# Patient Record
Sex: Male | Born: 1960 | Race: Black or African American | Hispanic: No | Marital: Single | Smoking: Never smoker
Health system: Southern US, Community
[De-identification: ages and names within clinical notes are randomized; demographics above are authoritative.]

## PROBLEM LIST (undated history)

## (undated) ENCOUNTER — Ambulatory Visit: Admission: EM | Source: Home / Self Care

## (undated) DIAGNOSIS — R569 Unspecified convulsions: Secondary | ICD-10-CM

## (undated) DIAGNOSIS — I10 Essential (primary) hypertension: Secondary | ICD-10-CM

## (undated) DIAGNOSIS — K2960 Other gastritis without bleeding: Secondary | ICD-10-CM

## (undated) HISTORY — PX: SHUNT EXTERNALIZATION: SHX341

---

## 2000-08-30 DIAGNOSIS — R569 Unspecified convulsions: Secondary | ICD-10-CM | POA: Insufficient documentation

## 2009-02-17 ENCOUNTER — Emergency Department: Payer: Self-pay | Admitting: Emergency Medicine

## 2009-11-08 DIAGNOSIS — I10 Essential (primary) hypertension: Secondary | ICD-10-CM | POA: Insufficient documentation

## 2011-11-29 ENCOUNTER — Emergency Department: Payer: Self-pay | Admitting: Unknown Physician Specialty

## 2012-01-09 ENCOUNTER — Emergency Department: Payer: Self-pay | Admitting: Emergency Medicine

## 2012-01-09 LAB — COMPREHENSIVE METABOLIC PANEL
Albumin: 4.3 g/dL (ref 3.4–5.0)
Alkaline Phosphatase: 76 U/L (ref 50–136)
Anion Gap: 8 (ref 7–16)
BUN: 13 mg/dL (ref 7–18)
Bilirubin,Total: 0.4 mg/dL (ref 0.2–1.0)
Calcium, Total: 9.5 mg/dL (ref 8.5–10.1)
Chloride: 98 mmol/L (ref 98–107)
Creatinine: 1.33 mg/dL — ABNORMAL HIGH (ref 0.60–1.30)
EGFR (Non-African Amer.): >60
Osmolality: 274 (ref 275–301)
Potassium: 3.8 mmol/L (ref 3.5–5.1)
SGPT (ALT): 25 U/L
Sodium: 137 mmol/L (ref 136–145)
Total Protein: 8.8 g/dL — ABNORMAL HIGH (ref 6.4–8.2)

## 2012-01-09 LAB — CBC
HGB: 16.2 g/dL (ref 13.0–18.0)
MCHC: 34.1 g/dL (ref 32.0–36.0)
RBC: 5.36 10*6/uL (ref 4.40–5.90)
WBC: 4.9 10*3/uL (ref 3.8–10.6)

## 2013-10-20 ENCOUNTER — Emergency Department: Payer: Self-pay | Admitting: Emergency Medicine

## 2013-10-20 LAB — URINALYSIS, COMPLETE
Bacteria: NONE SEEN
Bilirubin,UR: NEGATIVE
Glucose,UR: NEGATIVE mg/dL (ref 0–75)
Ketone: NEGATIVE
LEUKOCYTE ESTERASE: NEGATIVE
NITRITE: NEGATIVE
Ph: 5 (ref 4.5–8.0)
Protein: NEGATIVE
RBC,UR: 1 /HPF (ref 0–5)
Specific Gravity: 1.014 (ref 1.003–1.030)
Squamous Epithelial: <1
WBC UR: 1 /HPF (ref 0–5)

## 2013-10-20 LAB — BASIC METABOLIC PANEL
Anion Gap: 6 — ABNORMAL LOW (ref 7–16)
BUN: 14 mg/dL (ref 7–18)
CHLORIDE: 98 mmol/L (ref 98–107)
CREATININE: 1.43 mg/dL — AB (ref 0.60–1.30)
Calcium, Total: 9 mg/dL (ref 8.5–10.1)
Co2: 31 mmol/L (ref 21–32)
EGFR (African American): >60
GFR CALC NON AF AMER: 56 — AB
GLUCOSE: 137 mg/dL — AB (ref 65–99)
Osmolality: 273 (ref 275–301)
POTASSIUM: 3.2 mmol/L — AB (ref 3.5–5.1)
SODIUM: 135 mmol/L — AB (ref 136–145)

## 2013-10-20 LAB — HEPATIC FUNCTION PANEL A (ARMC)
Albumin: 4.2 g/dL (ref 3.4–5.0)
Alkaline Phosphatase: 50 U/L
Bilirubin, Direct: 0.1 mg/dL (ref 0.00–0.20)
Bilirubin,Total: 0.5 mg/dL (ref 0.2–1.0)
SGOT(AST): 22 U/L (ref 15–37)
SGPT (ALT): 21 U/L (ref 12–78)
TOTAL PROTEIN: 8.1 g/dL (ref 6.4–8.2)

## 2013-10-20 LAB — TROPONIN I
Troponin-I: <0.02 ng/mL
Troponin-I: <0.02 ng/mL

## 2013-10-20 LAB — CBC WITH DIFFERENTIAL/PLATELET
Basophil #: 0 10*3/uL (ref 0.0–0.1)
Basophil %: 0.4 %
Eosinophil #: 0.1 10*3/uL (ref 0.0–0.7)
Eosinophil %: 1.6 %
HCT: 42.7 % (ref 40.0–52.0)
HGB: 14.1 g/dL (ref 13.0–18.0)
Lymphocyte #: 0.3 10*3/uL — ABNORMAL LOW (ref 1.0–3.6)
Lymphocyte %: 4.9 %
MCH: 29.8 pg (ref 26.0–34.0)
MCHC: 33.1 g/dL (ref 32.0–36.0)
MCV: 90 fL (ref 80–100)
MONOS PCT: 7.1 %
Monocyte #: 0.5 x10 3/mm (ref 0.2–1.0)
NEUTROS ABS: 6 10*3/uL (ref 1.4–6.5)
NEUTROS PCT: 86 %
PLATELETS: 170 10*3/uL (ref 150–440)
RBC: 4.74 10*6/uL (ref 4.40–5.90)
RDW: 13.8 % (ref 11.5–14.5)
WBC: 7 10*3/uL (ref 3.8–10.6)

## 2013-10-20 LAB — TSH: THYROID STIMULATING HORM: 0.623 u[IU]/mL

## 2014-11-01 IMAGING — CR DG CHEST 2V
1 series · 2 of 2 positions shown · non-contrast
Comparison: 01/09/2012

CLINICAL DATA: Cough and nausea for several days

EXAM:
CHEST  2 VIEW

[Series 1: w chest pa · 0.14mm/px · 2 of 2 slices shown]
[im 1/2]
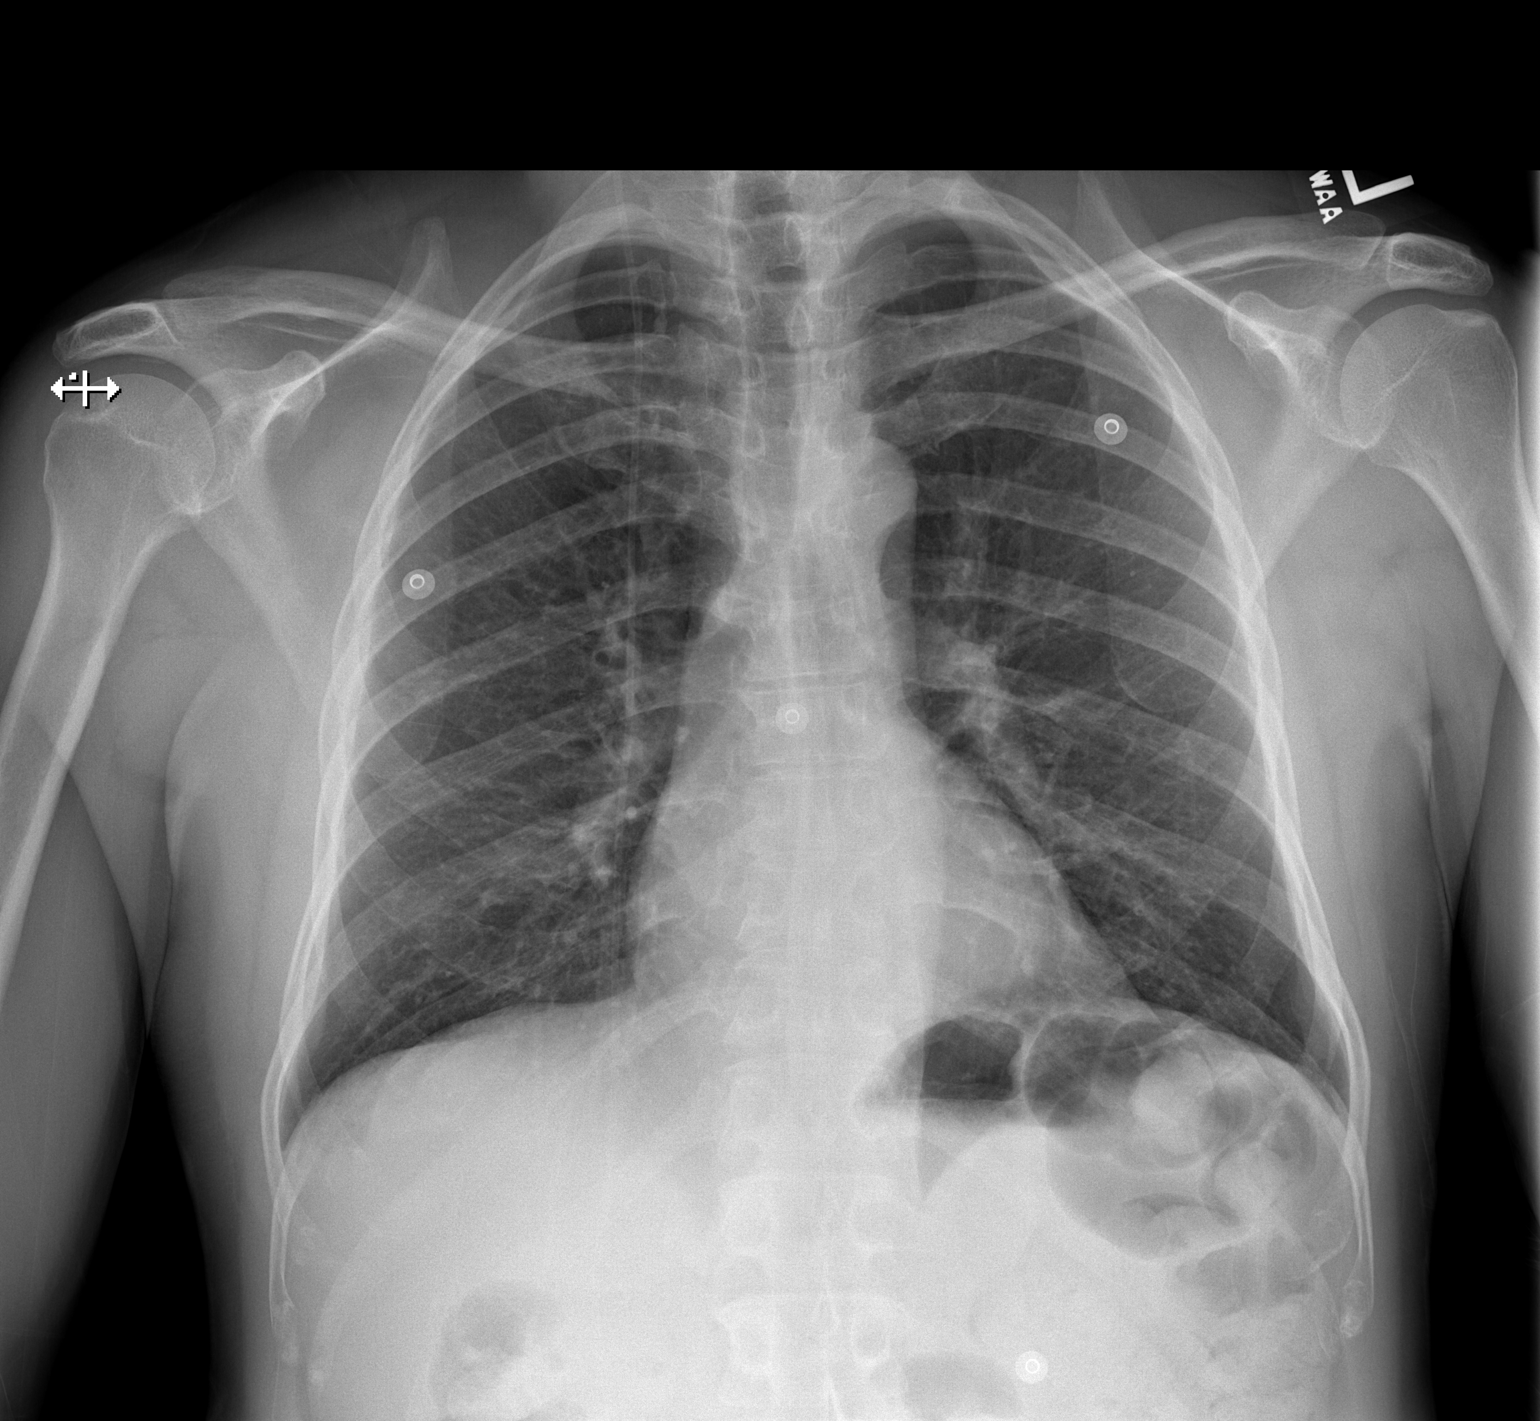
[im 2/2]
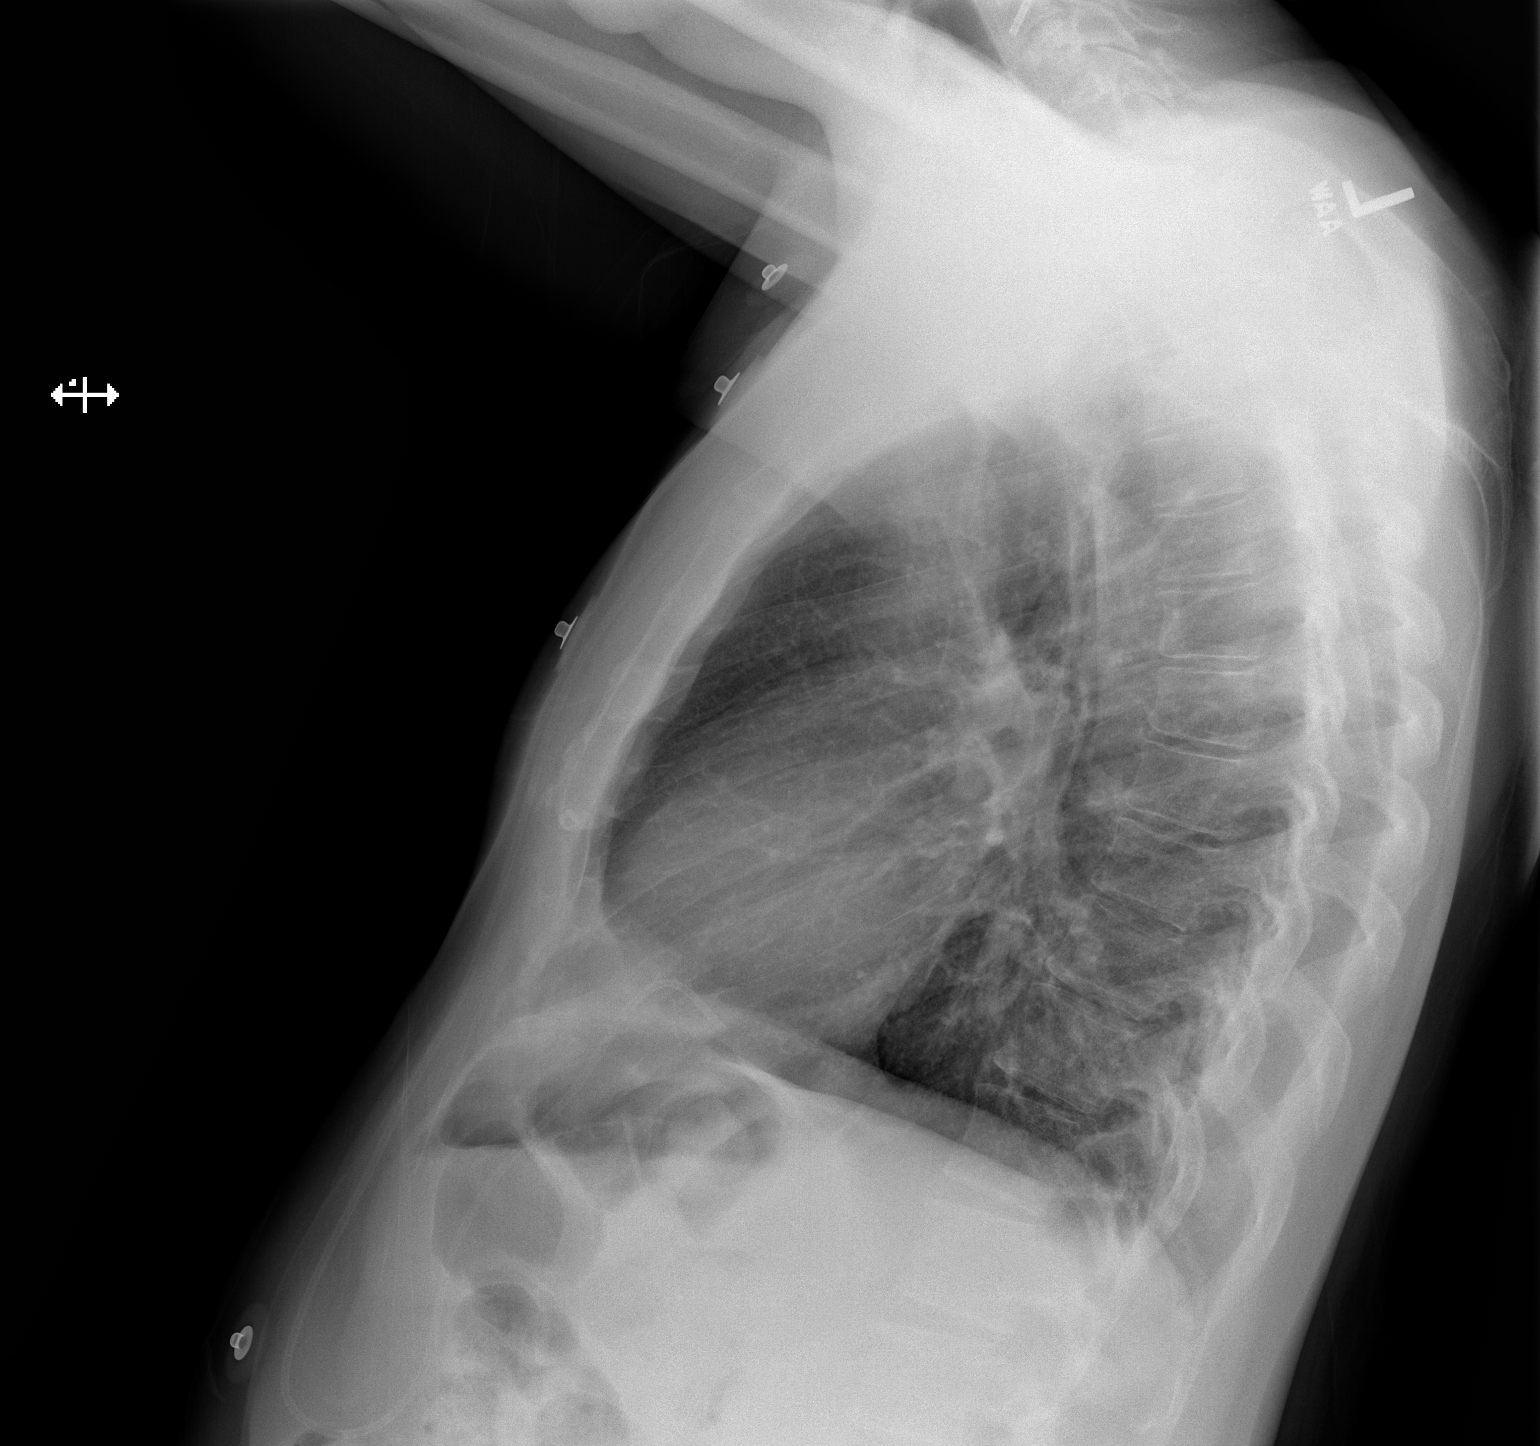

[2 of 2 positions shown; findings below may reference images not displayed]

FINDINGS: There is a right-sided ventriculoperitoneal shunt catheter. The
heart size and mediastinal contours are within normal limits. Both
lungs are clear. The visualized skeletal structures are
unremarkable.
IMPRESSION: No active cardiopulmonary disease.

## 2016-10-03 ENCOUNTER — Ambulatory Visit: Payer: Self-pay | Admitting: Pharmacy Technician

## 2016-10-03 ENCOUNTER — Encounter (INDEPENDENT_AMBULATORY_CARE_PROVIDER_SITE_OTHER): Payer: Self-pay

## 2016-10-03 DIAGNOSIS — Z79899 Other long term (current) drug therapy: Secondary | ICD-10-CM

## 2016-10-03 NOTE — Progress Notes (Signed)
Completed [facility] application and contract.  Patient agreed to all terms of the [facility] contract.  Patient to provide notarized letter of support and utility bill.  Provided patient with Community education officercommunity resource material based on his particular needs.    Sherilyn DacostaBetty J. Aneesa Romey Care Manager [facility]

## 2016-11-07 ENCOUNTER — Telehealth: Payer: Self-pay | Admitting: Pharmacy Technician

## 2016-11-07 NOTE — Telephone Encounter (Signed)
Patient provided poi.  Approved for medication assistance through 2018, as long as eligibility criteria continues to be met.  [facility]

## 2017-10-17 ENCOUNTER — Telehealth: Payer: Self-pay | Admitting: Pharmacy Technician

## 2017-10-17 NOTE — Telephone Encounter (Signed)
Patient failed to provide 2019 poi.  No additional medication assistance will be provided by [facility] without the required proof of income documentation.  Patient notified by letter.  Jadiel Schmieder J. Alizee Maple Care Manager [facility] 

## 2017-11-12 ENCOUNTER — Telehealth: Payer: Self-pay | Admitting: Pharmacy Technician

## 2017-11-12 NOTE — Telephone Encounter (Signed)
Received updated proof of income.  Patient eligible to receive medication assistance at [facility] through 2019, as long as eligibility requirements continue to be met.  [facility]

## 2017-12-19 ENCOUNTER — Encounter: Payer: Self-pay | Admitting: Pharmacist

## 2018-02-17 ENCOUNTER — Encounter: Payer: Self-pay | Admitting: Pharmacist

## 2018-12-10 ENCOUNTER — Ambulatory Visit
Admission: EM | Admit: 2018-12-10 | Discharge: 2018-12-10 | Disposition: A | Discharge status: Home / Self Care | Payer: Medicare Other | Attending: Family Medicine | Admitting: Family Medicine

## 2018-12-10 ENCOUNTER — Other Ambulatory Visit: Payer: Self-pay

## 2018-12-10 ENCOUNTER — Encounter: Payer: Self-pay | Admitting: Emergency Medicine

## 2018-12-10 DIAGNOSIS — R109 Unspecified abdominal pain: Secondary | ICD-10-CM | POA: Insufficient documentation

## 2018-12-10 DIAGNOSIS — R110 Nausea: Secondary | ICD-10-CM | POA: Diagnosis not present

## 2018-12-10 HISTORY — DX: Other gastritis without bleeding: K29.60

## 2018-12-10 HISTORY — DX: Unspecified convulsions: R56.9

## 2018-12-10 HISTORY — DX: Essential (primary) hypertension: I10

## 2018-12-10 LAB — URINALYSIS, COMPLETE (UACMP) WITH MICROSCOPIC
Bacteria, UA: NONE SEEN
Bilirubin Urine: NEGATIVE
Glucose, UA: NEGATIVE mg/dL
Hgb urine dipstick: NEGATIVE
Ketones, ur: NEGATIVE mg/dL
Leukocytes,Ua: NEGATIVE
Nitrite: NEGATIVE
Protein, ur: NEGATIVE mg/dL
Specific Gravity, Urine: 1.02 (ref 1.005–1.030)
Squamous Epithelial / HPF: NONE SEEN (ref 0–5)
pH: 7.5 (ref 5.0–8.0)

## 2018-12-10 MED ORDER — TAMSULOSIN HCL 0.4 MG PO CAPS: 0.4000 mg | ORAL_CAPSULE | Freq: Every day | ORAL | 0 refills | Status: DC

## 2018-12-10 MED ORDER — TRAMADOL HCL 50 MG PO TABS: 50.0000 mg | ORAL_TABLET | Freq: Three times a day (TID) | ORAL | 0 refills | Status: DC | PRN

## 2018-12-10 NOTE — ED Provider Notes (Signed)
[facility]    CSN: [id number] Arrival date & time: 12/10/18  1517  History   Chief Complaint Chief Complaint  Patient presents with  . Abdominal Pain   HPI  58 year old male presents with right flank pain.  Patient reports he developed right flank pain 3 to 4 weeks ago.  Has continued to persist.  Patient states that it is worse with certain movements.  Had severe pain yesterday.  Was given a "pain pill" from his mother with some improvement.  He reports associated nausea.  No abdominal pain.  No urinary symptoms.  Pain is currently mild to moderate in severity.  No known inciting factor.  No other associated symptoms. No other complaints.  History reviewed as below. PMH: Hypertension    Tinnitus    Depression    Seizures (CMS-HCC)    Hydrocephalus (CMS-HCC)    Hearing loss in right ear     Surgical Hx: VENTRICULOPERITONEAL SHUNT   58 years of age     Home Medications    Prior to Admission medications   Medication Sig Start Date End Date Taking? Authorizing Provider  atorvastatin (LIPITOR) 40 MG tablet  12/02/18  Yes [provider]  hydrochlorothiazide (HYDRODIURIL) 25 MG tablet TAKE ONE TABLET BY MOUTH ONCE DAILY 01/04/13  Yes [provider]  lamoTRIgine (LAMICTAL) 100 MG tablet  12/02/18  Yes [provider]  lisinopril (ZESTRIL) 10 MG tablet Take by mouth.   Yes [provider]  losartan (COZAAR) 50 MG tablet Take by mouth.   Yes [provider]  tamsulosin (FLOMAX) 0.4 MG CAPS capsule Take 1 capsule (0.4 mg total) by mouth daily. 12/10/18   Tommie Samsook, Courtland Coppa G, DO  traMADol (ULTRAM) 50 MG tablet Take 1 tablet (50 mg total) by mouth every 8 (eight) hours as needed for moderate pain or severe pain. 12/10/18   Tommie Samsook, Adlee Paar G, DO   Social History Social History   Tobacco Use  . Smoking status: Never Smoker  . Smokeless tobacco: Never Used  Substance Use Topics  . Alcohol use: Never    Frequency: Never  .  Drug use: Never    Allergies   Patient has no known allergies.   Review of Systems Review of Systems  Constitutional: Negative.   Gastrointestinal: Positive for nausea.  Genitourinary: Positive for flank pain.   Physical Exam Triage Vital Signs ED Triage Vitals  Enc Vitals Group     BP 12/10/18 1530 133/86     Pulse Rate 12/10/18 1530 83     Resp 12/10/18 1530 18     Temp 12/10/18 1530 98.4 F (36.9 C)     Temp Source 12/10/18 1530 Oral     SpO2 12/10/18 1530 98 %     Weight 12/10/18 1531 190 lb (86.2 kg)     Height 12/10/18 1531 5\' 2"  (1.575 m)     Head Circumference --      Peak Flow --      Pain Score 12/10/18 1531 5     Pain Loc --      Pain Edu? --      Excl. in GC? --    Updated Vital Signs BP 133/86 (BP Location: Right Arm)   Pulse 83   Temp 98.4 F (36.9 C) (Oral)   Resp 18   Ht 5\' 2"  (1.575 m)   Wt 86.2 kg   SpO2 98%   BMI 34.75 kg/m   Visual Acuity Right Eye Distance:   Left Eye Distance:  Bilateral Distance:    Right Eye Near:   Left Eye Near:    Bilateral Near:     Physical Exam Vitals signs and nursing note reviewed.  Constitutional:      General: He is not in acute distress.    Appearance: Normal appearance. He is obese.  HENT:     Head: Normocephalic and atraumatic.  Eyes:     General:        Right eye: No discharge.        Left eye: No discharge.     Conjunctiva/sclera: Conjunctivae normal.  Cardiovascular:     Rate and Rhythm: Normal rate and regular rhythm.  Pulmonary:     Effort: Pulmonary effort is normal.     Breath sounds: Normal breath sounds.  Abdominal:     General: There is no distension.     Palpations: Abdomen is soft.     Comments: No CVA tenderness.   Neurological:     Mental Status: He is alert.  Psychiatric:        Behavior: Behavior normal.     Comments: Flat affect.    UC Treatments / Results  Labs (all labs ordered are listed, but only abnormal results are displayed) Labs Reviewed  URINALYSIS,  COMPLETE (UACMP) WITH MICROSCOPIC    EKG None  Radiology No results found.  Procedures Procedures (including critical care time)  Medications Ordered in UC Medications - No data to display  Initial Impression / Assessment and Plan / UC Course  I have reviewed the triage vital signs and the nursing notes.  Pertinent labs & imaging results that were available during my care of the patient were reviewed by me and considered in my medical decision making (see chart for details).    58 year old male presents with flank pain.  Musculoskeletal versus secondary to nephrolithiasis.  I favor musculoskeletal etiology.  Urinalysis without hematuria.  Tramadol as needed.  Placing on Flomax as patient has history of BPH and may help if he has an underlying stone.  Final Clinical Impressions(s) / UC Diagnoses   Final diagnoses:  Flank pain     Discharge Instructions     Medication as directed.   Be sure to drink plenty of fluids.  Follow up with [facility] if this persists.  Take care  Dr. Adriana Simas     ED Prescriptions    Medication Sig Dispense Auth. Provider   tamsulosin (FLOMAX) 0.4 MG CAPS capsule Take 1 capsule (0.4 mg total) by mouth daily. 14 capsule Aurther Harlin G, DO   traMADol (ULTRAM) 50 MG tablet Take 1 tablet (50 mg total) by mouth every 8 (eight) hours as needed for moderate pain or severe pain. 15 tablet Tommie Sams, DO     Controlled Substance Prescriptions [place] Controlled Substance Registry consulted? Not Applicable   Tommie Sams, DO 12/10/18 1655

## 2018-12-10 NOTE — ED Triage Notes (Signed)
Patient c/o right side pain that started 3-4 weeks ago. He stated he woke up one morning and just had the pain and it hasn't gone away. Patient states he has had nausea but no other symptoms.

## 2018-12-10 NOTE — Discharge Instructions (Signed)
Medication as directed.   Be sure to drink plenty of fluids.  Follow up with [facility] if this persists.  Take care  Dr. Adriana Simas

## 2019-02-02 ENCOUNTER — Telehealth: Payer: Self-pay | Admitting: Pharmacy Technician

## 2019-02-02 NOTE — Telephone Encounter (Signed)
Patient has Medicare and a Part D plan. [facility] is unable to provide services until patient is in the gap. He can then apply by providing appropriate documentation and financial information. Patient notified by letter.  Velda Shell CPhT/Certification Specialist [facility]

## 2019-02-19 ENCOUNTER — Encounter: Payer: Self-pay | Admitting: Emergency Medicine

## 2019-02-19 ENCOUNTER — Other Ambulatory Visit: Payer: Self-pay

## 2019-02-19 ENCOUNTER — Ambulatory Visit
Admission: EM | Admit: 2019-02-19 | Discharge: 2019-02-19 | Disposition: A | Discharge status: Home / Self Care | Payer: Medicare Other | Attending: Family Medicine | Admitting: Family Medicine

## 2019-02-19 DIAGNOSIS — R05 Cough: Secondary | ICD-10-CM | POA: Diagnosis not present

## 2019-02-19 DIAGNOSIS — R5383 Other fatigue: Secondary | ICD-10-CM | POA: Diagnosis not present

## 2019-02-19 DIAGNOSIS — Z79899 Other long term (current) drug therapy: Secondary | ICD-10-CM | POA: Diagnosis not present

## 2019-02-19 DIAGNOSIS — R6889 Other general symptoms and signs: Secondary | ICD-10-CM | POA: Diagnosis not present

## 2019-02-19 DIAGNOSIS — G919 Hydrocephalus, unspecified: Secondary | ICD-10-CM | POA: Insufficient documentation

## 2019-02-19 DIAGNOSIS — J029 Acute pharyngitis, unspecified: Secondary | ICD-10-CM | POA: Insufficient documentation

## 2019-02-19 DIAGNOSIS — R0981 Nasal congestion: Secondary | ICD-10-CM | POA: Diagnosis not present

## 2019-02-19 DIAGNOSIS — I10 Essential (primary) hypertension: Secondary | ICD-10-CM | POA: Insufficient documentation

## 2019-02-19 DIAGNOSIS — Z20828 Contact with and (suspected) exposure to other viral communicable diseases: Secondary | ICD-10-CM | POA: Insufficient documentation

## 2019-02-19 DIAGNOSIS — Z20822 Contact with and (suspected) exposure to covid-19: Secondary | ICD-10-CM

## 2019-02-19 LAB — RAPID STREP SCREEN (MED CTR MEBANE ONLY): Streptococcus, Group A Screen (Direct): NEGATIVE

## 2019-02-19 MED ORDER — IPRATROPIUM BROMIDE 0.06 % NA SOLN: 2.0000 | Freq: Four times a day (QID) | NASAL | 0 refills | Status: AC | PRN

## 2019-02-19 MED ORDER — BENZONATATE 200 MG PO CAPS: 200.0000 mg | ORAL_CAPSULE | Freq: Three times a day (TID) | ORAL | 0 refills | Status: DC | PRN

## 2019-02-19 MED ORDER — IPRATROPIUM BROMIDE 0.06 % NA SOLN: 2.0000 | Freq: Four times a day (QID) | NASAL | 0 refills | Status: DC | PRN

## 2019-02-19 NOTE — ED Triage Notes (Signed)
Patient c/o nasal congestion, cough, sore throat that started 1 week ago. Has not checked his temperature.

## 2019-02-19 NOTE — Discharge Instructions (Signed)
Rest.  Fluids.  Medication as directed.   We will call with the results.  Take care  Dr. Lacinda Axon  Dr. Lacinda Axon

## 2019-02-19 NOTE — ED Provider Notes (Signed)
[facility]    CSN: [id number] Arrival date & time: 02/19/19  4166  History   Chief Complaint Chief Complaint  Patient presents with  . Cough  . Sore Throat  . Nasal Congestion   HPI  58 year old male presents with respiratory symptoms.  Patient reports that his symptoms started last week.  He reports sore throat, runny nose, cough.  Reports fatigue as well.  Cough is productive. No documented fever.  No reported sick contacts.  No medications or interventions tried.  No known exacerbating or relieving factors.  He is sore throat is 5/10 in severity.  No other reported symptoms.  No other complaints.  PMH, Surgical Hx, Family Hx, Social History reviewed and updated as below.  PMH: Hypertension    Tinnitus    Depression    Seizures (CMS-HCC)    Hydrocephalus (CMS-HCC)    Hearing loss in right ear      Surgical Hx: VENTRICULOPERITONEAL SHUNT   58 years of age     Home Medications    Prior to Admission medications   Medication Sig Start Date End Date Taking? Authorizing Provider  atorvastatin (LIPITOR) 40 MG tablet  12/02/18  Yes [provider]  hydrochlorothiazide (HYDRODIURIL) 25 MG tablet TAKE ONE TABLET BY MOUTH ONCE DAILY 01/04/13  Yes [provider]  lamoTRIgine (LAMICTAL) 100 MG tablet  12/02/18  Yes [provider]  lisinopril (ZESTRIL) 10 MG tablet Take by mouth.   Yes [provider]  losartan (COZAAR) 50 MG tablet Take by mouth.   Yes [provider]  tamsulosin (FLOMAX) 0.4 MG CAPS capsule Take 1 capsule (0.4 mg total) by mouth daily. 12/10/18  Yes Zariana Strub G, DO  traMADol (ULTRAM) 50 MG tablet Take 1 tablet (50 mg total) by mouth every 8 (eight) hours as needed for moderate pain or severe pain. 12/10/18  Yes Devaughn Savant G, DO  benzonatate (TESSALON) 200 MG capsule Take 1 capsule (200 mg total) by mouth 3 (three) times daily as needed for cough. 02/19/19   Thersa Salt G, DO  ipratropium  (ATROVENT) 0.06 % nasal spray Place 2 sprays into both nostrils 4 (four) times daily as needed for rhinitis. 02/19/19   Coral Spikes, DO   Family Hx: Hypertension Brother    Kidney disease Mother    GU problems Neg Hx    Prostate cancer Neg Hx      Social History Social History   Tobacco Use  . Smoking status: Never Smoker  . Smokeless tobacco: Never Used  Substance Use Topics  . Alcohol use: Never    Frequency: Never  . Drug use: Never     Allergies   Patient has no known allergies.   Review of Systems Review of Systems  Constitutional: Positive for fatigue. Negative for fever.  HENT: Positive for rhinorrhea and sore throat.   Respiratory: Positive for cough.    Physical Exam Triage Vital Signs ED Triage Vitals  Enc Vitals Group     BP 02/19/19 0841 (!) 144/77     Pulse Rate 02/19/19 0841 95     Resp 02/19/19 0841 18     Temp 02/19/19 0841 99.5 F (37.5 C)     Temp Source 02/19/19 0841 Oral     SpO2 02/19/19 0841 96 %     Weight --      Height 02/19/19 0844 5\' 1"  (1.549 m)     Head Circumference --      Peak Flow --  Pain Score 02/19/19 0843 5     Pain Loc --      Pain Edu? --      Excl. in GC? --    Updated Vital Signs BP (!) 144/77 (BP Location: Right Arm)   Pulse 95   Temp 99.5 F (37.5 C) (Oral)   Resp 18   Ht 5\' 1"  (1.549 m)   SpO2 96%   BMI 35.90 kg/m   Visual Acuity Right Eye Distance:   Left Eye Distance:   Bilateral Distance:    Right Eye Near:   Left Eye Near:    Bilateral Near:     Physical Exam Vitals signs and nursing note reviewed.  Constitutional:      General: He is not in acute distress.    Appearance: Normal appearance.  HENT:     Head: Normocephalic and atraumatic.     Right Ear: Tympanic membrane normal.     Left Ear: Tympanic membrane normal.     Mouth/Throat:     Pharynx: Posterior oropharyngeal erythema present. No oropharyngeal exudate.  Eyes:     General:        Right eye: No discharge.         Left eye: No discharge.     Conjunctiva/sclera: Conjunctivae normal.  Cardiovascular:     Rate and Rhythm: Normal rate and regular rhythm.  Pulmonary:     Effort: Pulmonary effort is normal.     Breath sounds: Normal breath sounds. No wheezing, rhonchi or rales.  Neurological:     Mental Status: He is alert.  Psychiatric:        Mood and Affect: Mood normal.        Behavior: Behavior normal.    UC Treatments / Results  Labs (all labs ordered are listed, but only abnormal results are displayed) Labs Reviewed  RAPID STREP SCREEN ([facility] ONLY)  NOVEL CORONAVIRUS, NAA (HOSPITAL ORDER, SEND-OUT TO REF LAB)  CULTURE, GROUP A STREP [facility])    EKG   Radiology No results found.  Procedures Procedures (including critical care time)  Medications Ordered in UC Medications - No data to display  Initial Impression / Assessment and Plan / UC Course  I have reviewed the triage vital signs and the nursing notes.  Pertinent labs & imaging results that were available during my care of the patient were reviewed by me and considered in my medical decision making (see chart for details).    58 year old male presents with possible COVID-19.  Strep negative.  Awaiting COVID testing results.  Treating symptomatically with Tessalon Perles and Atrovent nasal spray.  Final Clinical Impressions(s) / UC Diagnoses   Final diagnoses:  Suspected Covid-19 Virus Infection     Discharge Instructions     Rest.  Fluids.  Medication as directed.   We will call with the results.  Take care  Dr. Adriana Simasook  Dr. Adriana Simasook   ED Prescriptions    Medication Sig Dispense Auth. Provider   benzonatate (TESSALON) 200 MG capsule Take 1 capsule (200 mg total) by mouth 3 (three) times daily as needed for cough. 20 capsule Cherene Dobbins G, DO   ipratropium (ATROVENT) 0.06 % nasal spray Place 2 sprays into both nostrils 4 (four) times daily as needed for rhinitis. 15 mL Tommie Samsook, Younes Degeorge G, DO     Controlled  Substance Prescriptions [place] Controlled Substance Registry consulted? Not Applicable   Tommie SamsCook, Brittinie Wherley G, [place]DO 02/19/19 [id number]

## 2019-02-20 LAB — NOVEL CORONAVIRUS, NAA (HOSP ORDER, SEND-OUT TO REF LAB; TAT 18-24 HRS): SARS-CoV-2, NAA: NOT DETECTED

## 2019-02-21 LAB — CULTURE, GROUP A STREP (THRC)

## 2020-07-28 ENCOUNTER — Encounter: Payer: Self-pay | Admitting: Family Medicine

## 2020-08-24 ENCOUNTER — Other Ambulatory Visit: Payer: Self-pay

## 2020-08-24 ENCOUNTER — Encounter: Payer: Self-pay | Admitting: Gastroenterology

## 2020-08-24 ENCOUNTER — Ambulatory Visit: Payer: Medicare HMO | Admitting: Gastroenterology

## 2020-08-24 VITALS — BP 134/79 | HR 88 | Ht 62.0 in | Wt 158.6 lb

## 2020-08-24 DIAGNOSIS — Z1211 Encounter for screening for malignant neoplasm of colon: Secondary | ICD-10-CM | POA: Diagnosis not present

## 2020-08-24 DIAGNOSIS — K219 Gastro-esophageal reflux disease without esophagitis: Secondary | ICD-10-CM | POA: Diagnosis not present

## 2020-08-24 MED ORDER — NA SULFATE-K SULFATE-MG SULF 17.5-3.13-1.6 GM/177ML PO SOLN: 1.0000 | Freq: Once | ORAL | 0 refills | Status: AC

## 2020-08-24 NOTE — Progress Notes (Signed)
Wyline Mood MD, MRCP(U.K) [address]  [address]  [place], [place] [address]  Main: [phone]  Fax: [phone]   Gastroenterology Consultation  Referring Provider:     Preston Fleeting* Primary Care Physician:  Patient, No Pcp Per Primary Gastroenterologist:  Dr. Wyline Mood  Reason for Consultation:     GERD and screening colonoscopy        HPI:   Gregory Rose is a 60 y.o. y/o male referred for GERD and screening colonoscopy.   Reflux:  Onset : few years < 10  Symptoms: Heartburn Recent weight gain: None Medications: Taking omeprazole 20 mg twice daily but after meals Narcotics or anticholinergics use : None PPI /H2 blockers or Antacid  use and timing : As above Dinner time : Sometimes misses dinner Prior EGD: None   Last colonoscopy : None Rectal bleeding : None Change in bowel habits : None Weight loss : None Family history of colon cancer or polyps : Unclear      Past Medical History:  Diagnosis Date  . Hypertension   . Reflux gastritis   . Seizures (HCC)     No past surgical history on file.  Prior to Admission medications   Medication Sig Start Date End Date Taking? Authorizing Provider  atorvastatin (LIPITOR) 40 MG tablet  12/02/18   [provider]  benzonatate (TESSALON) 200 MG capsule Take 1 capsule (200 mg total) by mouth 3 (three) times daily as needed for cough. 02/19/19   Tommie Sams, DO  hydrochlorothiazide (HYDRODIURIL) 25 MG tablet TAKE ONE TABLET BY MOUTH ONCE DAILY 01/04/13   [provider]  ipratropium (ATROVENT) 0.06 % nasal spray Place 2 sprays into both nostrils 4 (four) times daily as needed for rhinitis. 02/19/19   Tommie Sams, DO  lamoTRIgine (LAMICTAL) 100 MG tablet  12/02/18   [provider]  lisinopril (ZESTRIL) 10 MG tablet Take by mouth.    [provider]  losartan (COZAAR) 50 MG tablet Take by mouth.    [provider]  tamsulosin (FLOMAX) 0.4 MG CAPS capsule  Take 1 capsule (0.4 mg total) by mouth daily. 12/10/18   Tommie Sams, DO  traMADol (ULTRAM) 50 MG tablet Take 1 tablet (50 mg total) by mouth every 8 (eight) hours as needed for moderate pain or severe pain. 12/10/18   Tommie Sams, DO    No family history on file.   Social History   Tobacco Use  . Smoking status: Never Smoker  . Smokeless tobacco: Never Used  Substance Use Topics  . Alcohol use: Never  . Drug use: Never    Allergies as of 08/24/2020  . (No Known Allergies)    Review of Systems:    All systems reviewed and negative except where noted in HPI.   Physical Exam:  There were no vitals taken for this visit. No LMP for male patient. Psych:  Alert and cooperative. Normal mood and affect. General:   Alert,  Well-developed, well-nourished, pleasant and cooperative in NAD Head:  Normocephalic and atraumatic. Lungs:  Respirations even and unlabored.  Clear throughout to auscultation.   No wheezes, crackles, or rhonchi. No acute distress. Heart:  Regular rate and rhythm; no murmurs, clicks, rubs, or gallops. Abdomen:  Normal bowel sounds.  No bruits.  Soft, non-tender and non-distended without  Neurologic:  Alert and oriented x3;  grossly normal neurologically. Psych:  Alert and cooperative. Normal mood and affect.  Imaging Studies: No results found.  Assessment  and Plan:   Gregory Rose is a 60 y.o. y/o male has been referred for GERD and colon cancer screening  Plan #1.  Colonoscopy #2.  GERD patient information provided and discussed about lifestyle changes.  Picture of a wedge pillow provided 3.  Suggested to take his omeprazole 20 mg twice daily on an empty stomach first thing in the morning and at nighttime before his dinner   I have discussed alternative options, risks & benefits,  which include, but are not limited to, bleeding, infection, perforation,respiratory complication & drug reaction.  The patient agrees with this plan & written consent will be  obtained.     Follow up in 4 months  Dr Wyline Mood MD,MRCP(U.K)

## 2020-08-24 NOTE — Patient Instructions (Signed)
Gastroesophageal Reflux Disease, Adult  Gastroesophageal reflux (GER) happens when acid from the stomach flows up into the tube that connects the mouth and the stomach (esophagus). Normally, food travels down the esophagus and stays in the stomach to be digested. With GER, food and stomach acid sometimes move back up into the esophagus. You may have a disease called gastroesophageal reflux disease (GERD) if the reflux:  Happens often.  Causes frequent or very bad symptoms.  Causes problems such as damage to the esophagus. When this happens, the esophagus becomes sore and swollen. Over time, GERD can make small holes (ulcers) in the lining of the esophagus. What are the causes? This condition is caused by a problem with the muscle between the esophagus and the stomach. When this muscle is weak or not normal, it does not close properly to keep food and acid from coming back up from the stomach. The muscle can be weak because of:  Tobacco use.  Pregnancy.  Having a certain type of hernia (hiatal hernia).  Alcohol use.  Certain foods and drinks, such as coffee, chocolate, onions, and peppermint. What increases the risk?  Being overweight.  Having a disease that affects your connective tissue.  Taking NSAIDs, such a ibuprofen. What are the signs or symptoms?  Heartburn.  Difficult or painful swallowing.  The feeling of having a lump in the throat.  A bitter taste in the mouth.  Bad breath.  Having a lot of saliva.  Having an upset or bloated stomach.  Burping.  Chest pain. Different conditions can cause chest pain. Make sure you see your doctor if you have chest pain.  Shortness of breath or wheezing.  A long-term cough or a cough at night.  Wearing away of the surface of teeth (tooth enamel).  Weight loss. How is this treated?  Making changes to your diet.  Taking medicine.  Having surgery. Treatment will depend on how bad your symptoms are. Follow these  instructions at home: Eating and drinking  Follow a diet as told by your doctor. You may need to avoid foods and drinks such as: ? Coffee and tea, with or without caffeine. ? Drinks that contain alcohol. ? Energy drinks and sports drinks. ? Bubbly (carbonated) drinks or sodas. ? Chocolate and cocoa. ? Peppermint and mint flavorings. ? Garlic and onions. ? Horseradish. ? Spicy and acidic foods. These include peppers, chili powder, curry powder, vinegar, hot sauces, and BBQ sauce. ? Citrus fruit juices and citrus fruits, such as oranges, lemons, and limes. ? Tomato-based foods. These include red sauce, chili, salsa, and pizza with red sauce. ? Fried and fatty foods. These include donuts, french fries, potato chips, and high-fat dressings. ? High-fat meats. These include hot dogs, rib eye steak, sausage, ham, and bacon. ? High-fat dairy items, such as whole milk, butter, and cream cheese.  Eat small meals often. Avoid eating large meals.  Avoid drinking large amounts of liquid with your meals.  Avoid eating meals during the 2-3 hours before bedtime.  Avoid lying down right after you eat.  Do not exercise right after you eat.   Lifestyle  Do not smoke or use any products that contain nicotine or tobacco. If you need help quitting, ask your doctor.  Try to lower your stress. If you need help doing this, ask your doctor.  If you are overweight, lose an amount of weight that is healthy for you. Ask your doctor about a safe weight loss goal.   General instructions    Pay attention to any changes in your symptoms.  Take over-the-counter and prescription medicines only as told by your doctor.  Do not take aspirin, ibuprofen, or other NSAIDs unless your doctor says it is okay.  Wear loose clothes. Do not wear anything tight around your waist.  Raise (elevate) the head of your bed about 6 inches (15 cm). You may need to use a wedge to do this.  Avoid bending over if this makes your  symptoms worse.  Keep all follow-up visits. Contact a doctor if:  You have new symptoms.  You lose weight and you do not know why.  You have trouble swallowing or it hurts to swallow.  You have wheezing or a cough that keeps happening.  You have a hoarse voice.  Your symptoms do not get better with treatment. Get help right away if:  You have sudden pain in your arms, neck, jaw, teeth, or back.  You suddenly feel sweaty, dizzy, or light-headed.  You have chest pain or shortness of breath.  You vomit and the vomit is green, yellow, or black, or it looks like blood or coffee grounds.  You faint.  Your poop (stool) is red, bloody, or black.  You cannot swallow, drink, or eat. These symptoms may represent a serious problem that is an emergency. Do not wait to see if the symptoms will go away. Get medical help right away. Call your local emergency services (911 in the [place] Do not drive yourself to the hospital. Summary  If a person has gastroesophageal reflux disease (GERD), food and stomach acid move back up into the esophagus and cause symptoms or problems such as damage to the esophagus.  Treatment will depend on how bad your symptoms are.  Follow a diet as told by your doctor.  Take all medicines only as told by your doctor. This information is not intended to replace advice given to you by your health care provider. Make sure you discuss any questions you have with your health care provider. Document Revised: 01/04/2020 Document Reviewed: 01/04/2020 Elsevier Patient Education  2021 [employer]vier Inc.  

## 2020-09-02 ENCOUNTER — Other Ambulatory Visit
Admission: RE | Admit: 2020-09-02 | Discharge: 2020-09-02 | Disposition: A | Discharge status: Home / Self Care | Payer: Medicare HMO | Source: Ambulatory Visit | Attending: Gastroenterology | Admitting: Gastroenterology

## 2020-09-02 ENCOUNTER — Other Ambulatory Visit: Payer: Self-pay

## 2020-09-02 DIAGNOSIS — Z20822 Contact with and (suspected) exposure to covid-19: Secondary | ICD-10-CM | POA: Diagnosis not present

## 2020-09-02 DIAGNOSIS — Z01812 Encounter for preprocedural laboratory examination: Secondary | ICD-10-CM | POA: Insufficient documentation

## 2020-09-02 LAB — SARS CORONAVIRUS 2 (TAT 6-24 HRS): SARS Coronavirus 2: NEGATIVE

## 2020-09-05 ENCOUNTER — Other Ambulatory Visit: Payer: Self-pay

## 2020-09-05 MED ORDER — PEG 3350-KCL-NA BICARB-NACL 420 G PO SOLR: 4000.0000 mL | Freq: Once | ORAL | 0 refills | Status: AC

## 2020-09-05 NOTE — Progress Notes (Signed)
Procedure had to be rescheduled per Endo unit. Updated instructions will be mailed out today. Pt verbalized understanding.

## 2020-09-19 ENCOUNTER — Other Ambulatory Visit: Payer: Self-pay

## 2020-09-19 ENCOUNTER — Other Ambulatory Visit
Admission: RE | Admit: 2020-09-19 | Discharge: 2020-09-19 | Disposition: A | Discharge status: Home / Self Care | Payer: Medicare HMO | Source: Ambulatory Visit | Attending: Gastroenterology | Admitting: Gastroenterology

## 2020-09-19 ENCOUNTER — Telehealth: Payer: Self-pay

## 2020-09-19 DIAGNOSIS — Z01812 Encounter for preprocedural laboratory examination: Secondary | ICD-10-CM | POA: Insufficient documentation

## 2020-09-19 DIAGNOSIS — Z20822 Contact with and (suspected) exposure to covid-19: Secondary | ICD-10-CM | POA: Diagnosis not present

## 2020-09-19 LAB — SARS CORONAVIRUS 2 (TAT 6-24 HRS): SARS Coronavirus 2: NEGATIVE

## 2020-09-19 NOTE — Telephone Encounter (Signed)
Called patient to have procedure rescheduled. Unable to leave message.

## 2020-09-20 ENCOUNTER — Telehealth: Payer: Self-pay | Admitting: Gastroenterology

## 2020-09-20 NOTE — Telephone Encounter (Signed)
Please call patient to review instructions for his procedure scheduled for 09/21/20 per his sister.

## 2020-09-20 NOTE — Telephone Encounter (Signed)
Returned patients call. Explained bowel prep instructions to patient and informed him he will need to report to the hospital at 6:45am tomorrow morning. Pt verbalized understanding.

## 2020-09-21 ENCOUNTER — Other Ambulatory Visit: Payer: Self-pay

## 2020-09-21 ENCOUNTER — Ambulatory Visit: Payer: Medicare HMO | Admitting: Certified Registered Nurse Anesthetist

## 2020-09-21 ENCOUNTER — Ambulatory Visit
Admission: RE | Admit: 2020-09-21 | Discharge: 2020-09-21 | Disposition: A | Discharge status: Home / Self Care | Payer: Medicare HMO | Attending: Gastroenterology | Admitting: Gastroenterology

## 2020-09-21 ENCOUNTER — Encounter
Admission: RE | Disposition: A | Discharge status: Home / Self Care | Payer: Self-pay | Source: Home / Self Care | Attending: Gastroenterology

## 2020-09-21 ENCOUNTER — Encounter: Payer: Self-pay | Admitting: Gastroenterology

## 2020-09-21 DIAGNOSIS — Z79899 Other long term (current) drug therapy: Secondary | ICD-10-CM | POA: Insufficient documentation

## 2020-09-21 DIAGNOSIS — K635 Polyp of colon: Secondary | ICD-10-CM | POA: Diagnosis not present

## 2020-09-21 DIAGNOSIS — Z1211 Encounter for screening for malignant neoplasm of colon: Secondary | ICD-10-CM | POA: Diagnosis not present

## 2020-09-21 HISTORY — PX: COLONOSCOPY WITH PROPOFOL: SHX5780

## 2020-09-21 SURGERY — COLONOSCOPY WITH PROPOFOL
Anesthesia: General

## 2020-09-21 MED ORDER — SODIUM CHLORIDE 0.9 % IV SOLN: INTRAVENOUS | Status: DC

## 2020-09-21 MED ORDER — LIDOCAINE HCL (PF) 2 % IJ SOLN
INTRAMUSCULAR | Status: AC
  Filled 2020-09-21: qty 5

## 2020-09-21 MED ORDER — PROPOFOL 500 MG/50ML IV EMUL
INTRAVENOUS | Status: DC | PRN
  Administered 2020-09-21: 175 ug/kg/min via INTRAVENOUS

## 2020-09-21 MED ORDER — LIDOCAINE HCL (CARDIAC) PF 100 MG/5ML IV SOSY
PREFILLED_SYRINGE | INTRAVENOUS | Status: DC | PRN
  Administered 2020-09-21: 50 mg via INTRAVENOUS

## 2020-09-21 MED ORDER — PROPOFOL 10 MG/ML IV BOLUS
INTRAVENOUS | Status: DC | PRN
  Administered 2020-09-21: 80 mg via INTRAVENOUS

## 2020-09-21 MED ORDER — GLYCOPYRROLATE 0.2 MG/ML IJ SOLN
INTRAMUSCULAR | Status: AC
  Filled 2020-09-21: qty 1

## 2020-09-21 MED ORDER — PHENYLEPHRINE HCL (PRESSORS) 10 MG/ML IV SOLN
INTRAVENOUS | Status: AC
  Filled 2020-09-21: qty 1

## 2020-09-21 MED ORDER — PROPOFOL 500 MG/50ML IV EMUL
INTRAVENOUS | Status: AC
  Filled 2020-09-21: qty 50

## 2020-09-21 NOTE — Anesthesia Postprocedure Evaluation (Signed)
Anesthesia Post Note  Patient: Gregory Rose  Procedure(s) Performed: COLONOSCOPY WITH PROPOFOL (N/A )  Patient location during evaluation: PACU Anesthesia Type: General Level of consciousness: awake and alert Pain management: pain level controlled Vital Signs Assessment: post-procedure vital signs reviewed and stable Respiratory status: spontaneous breathing, nonlabored ventilation, respiratory function stable and patient connected to nasal cannula oxygen Cardiovascular status: blood pressure returned to baseline and stable Postop Assessment: no apparent nausea or vomiting Anesthetic complications: no   No complications documented.   Last Vitals:  Vitals:   09/21/20 0827 09/21/20 0837  BP: (!) 136/93 (!) 139/98  Pulse: 92 91  Resp: 20 15  Temp:    SpO2: 100% 100%    Last Pain:  Vitals:   09/21/20 0837  TempSrc:   PainSc: 0-No pain                 Yevette Edwards

## 2020-09-21 NOTE — H&P (Signed)
Wyline Mood, MD [address], [address], [place], [place], [address] [address], [address], [place], [place], [address] Phone: [phone]  Fax: [phone]  Primary Care Physician:  Preston Fleeting, MD   Pre-Procedure History & Physical: HPI:  Gregory Rose is a 60 y.o. male is here for an colonoscopy.   Past Medical History:  Diagnosis Date  . Hypertension   . Reflux gastritis   . Seizures (HCC)     Past Surgical History:  Procedure Laterality Date  . SHUNT EXTERNALIZATION      Prior to Admission medications   Medication Sig Start Date End Date Taking? Authorizing Provider  atorvastatin (LIPITOR) 40 MG tablet  12/02/18  Yes [provider]  famotidine (PEPCID) 20 MG tablet Take 20 mg by mouth 2 (two) times daily.   Yes [provider]  hydrochlorothiazide (HYDRODIURIL) 25 MG tablet TAKE ONE TABLET BY MOUTH ONCE DAILY 01/04/13  Yes [provider]  lamoTRIgine (LAMICTAL) 100 MG tablet  12/02/18  Yes [provider]  lisinopril (ZESTRIL) 10 MG tablet Take by mouth.   Yes [provider]  losartan (COZAAR) 50 MG tablet Take by mouth.   Yes [provider]  omeprazole (PRILOSEC) 20 MG capsule Take 20 mg by mouth daily. 03/09/20  Yes [provider]  tamsulosin (FLOMAX) 0.4 MG CAPS capsule Take 1 capsule (0.4 mg total) by mouth daily. 12/10/18  Yes Cook, Jayce G, DO  traMADol (ULTRAM) 50 MG tablet Take 1 tablet (50 mg total) by mouth every 8 (eight) hours as needed for moderate pain or severe pain. 12/10/18  Yes Cook, Jayce G, DO  benzonatate (TESSALON) 200 MG capsule Take 1 capsule (200 mg total) by mouth 3 (three) times daily as needed for cough. 02/19/19   Everlene Other G, DO  ipratropium (ATROVENT) 0.06 % nasal spray Place 2 sprays into both nostrils 4 (four) times daily as needed for rhinitis. 02/19/19   Tommie Sams, DO    Allergies as of 08/24/2020  . (No Known Allergies)    History reviewed. No  pertinent family history.  Social History   Socioeconomic History  . Marital status: Single    Spouse name: Not on file  . Number of children: Not on file  . Years of education: Not on file  . Highest education level: Not on file  Occupational History  . Not on file  Tobacco Use  . Smoking status: Never Smoker  . Smokeless tobacco: Never Used  Substance and Sexual Activity  . Alcohol use: Never  . Drug use: Never  . Sexual activity: Not on file  Other Topics Concern  . Not on file  Social History Narrative  . Not on file   Social Determinants of Health   Financial Resource Strain: Not on file  Food Insecurity: Not on file  Transportation Needs: Not on file  Physical Activity: Not on file  Stress: Not on file  Social Connections: Not on file  Intimate Partner Violence: Not on file    Review of Systems: See HPI, otherwise negative ROS  Physical Exam: BP 137/86   Pulse 87   Temp (!) 97.2 F (36.2 C) (Temporal)   Resp 18   Ht 5' (1.524 m)   Wt 102 kg   SpO2 99%   BMI 43.92 kg/m  General:   Alert,  pleasant and cooperative in NAD Head:  Normocephalic and atraumatic. Neck:  Supple; no masses or thyromegaly. Lungs:  Clear throughout to  auscultation, normal respiratory effort.    Heart:  +S1, +S2, Regular rate and rhythm, No edema. Abdomen:  Soft, nontender and nondistended. Normal bowel sounds, without guarding, and without rebound.   Neurologic:  Alert and  oriented x4;  grossly normal neurologically.  Impression/Plan: Mcguire Gasparyan is here for an colonoscopy to be performed for Screening colonoscopy average risk   Risks, benefits, limitations, and alternatives regarding  colonoscopy have been reviewed with the patient.  Questions have been answered.  All parties agreeable.   Wyline Mood, MD  09/21/2020, 7:45 AM

## 2020-09-21 NOTE — Transfer of Care (Signed)
Immediate Anesthesia Transfer of Care Note  Patient: Gregory Rose  Procedure(s) Performed: COLONOSCOPY WITH PROPOFOL (N/A )  Patient Location: PACU  Anesthesia Type:General  Level of Consciousness: sedated  Airway & Oxygen Therapy: Patient Spontanous Breathing  Post-op Assessment: Report given to RN and Post -op Vital signs reviewed and stable  Post vital signs: Reviewed and stable  Last Vitals:  Vitals Value Taken Time  BP 104/73 09/21/20 0819  Temp 35.8 C 09/21/20 0817  Pulse 92 09/21/20 0819  Resp 28 09/21/20 0819  SpO2 98 % 09/21/20 0819    Last Pain:  Vitals:   09/21/20 0817  TempSrc: Temporal  PainSc: Asleep         Complications: No complications documented.

## 2020-09-21 NOTE — Anesthesia Preprocedure Evaluation (Signed)
Anesthesia Evaluation  Patient identified by MRN, date of birth, ID band Patient awake    Reviewed: Allergy & Precautions, H&P , NPO status , Patient's Chart, lab work & pertinent test results, reviewed documented beta blocker date and time   Airway Mallampati: II   Neck ROM: full    Dental  (+) Poor Dentition, Teeth Intact   Pulmonary neg pulmonary ROS,    Pulmonary exam normal        Cardiovascular Exercise Tolerance: Poor hypertension, On Medications negative cardio ROS Normal cardiovascular exam Rhythm:regular Rate:Normal     Neuro/Psych Seizures -, Well Controlled,  negative psych ROS   GI/Hepatic negative GI ROS, Neg liver ROS,   Endo/Other  negative endocrine ROS  Renal/GU negative Renal ROS  negative genitourinary   Musculoskeletal   Abdominal   Peds  Hematology negative hematology ROS (+)   Anesthesia Other Findings Past Medical History: No date: Hypertension No date: Reflux gastritis No date: Seizures (HCC) Past Surgical History: No date: SHUNT EXTERNALIZATION   Reproductive/Obstetrics negative OB ROS                             Anesthesia Physical Anesthesia Plan  ASA: II  Anesthesia Plan: General   Post-op Pain Management:    Induction:   PONV Risk Score and Plan:   Airway Management Planned:   Additional Equipment:   Intra-op Plan:   Post-operative Plan:   Informed Consent: I have reviewed the patients History and Physical, chart, labs and discussed the procedure including the risks, benefits and alternatives for the proposed anesthesia with the patient or authorized representative who has indicated his/her understanding and acceptance.     Dental Advisory Given  Plan Discussed with: CRNA  Anesthesia Plan Comments:         Anesthesia Quick Evaluation

## 2020-09-21 NOTE — Op Note (Signed)
[facility] Patient Name: Gregory Rose Procedure Date: 09/21/2020 7:29 AM MRN: [id number] Account #: [id number] Date of Birth: March 09, 1961 Admit Type: Outpatient Age: 60 Room: [facility] ENDO ROOM 4 Gender: Male Note Status: Finalized Procedure:             Colonoscopy Indications:           Screening for colorectal malignant neoplasm Providers:             Wyline Mood MD, MD Referring MD:          Preston Fleeting (Referring MD) Medicines:             Monitored Anesthesia Care Complications:         No immediate complications. Procedure:             Pre-Anesthesia Assessment:                        - Prior to the procedure, a History and Physical was                         performed, and patient medications, allergies and                         sensitivities were reviewed. The patient's tolerance                         of previous anesthesia was reviewed.                        - The risks and benefits of the procedure and the                         sedation options and risks were discussed with the                         patient. All questions were answered and informed                         consent was obtained.                        - ASA Grade Assessment: II - A patient with mild                         systemic disease.                        After obtaining informed consent, the colonoscope was                         passed under direct vision. Throughout the procedure,                         the patient's blood pressure, pulse, and oxygen                         saturations were monitored continuously. The                         Colonoscope was introduced through the anus and  advanced to the the cecum, identified by the                         appendiceal orifice. The colonoscopy was performed                         with ease. The patient tolerated the procedure well.                         The quality  of the bowel preparation was fair. Findings:      The perianal and digital rectal examinations were normal.      A 5 mm polyp was found in the cecum. The polyp was sessile. The polyp       was removed with a cold snare. Resection and retrieval were complete.      The exam was otherwise without abnormality on direct and retroflexion       views. Impression:            - Preparation of the colon was fair.                        - One 5 mm polyp in the cecum, removed with a cold                         snare. Resected and retrieved.                        - The examination was otherwise normal on direct and                         retroflexion views. Recommendation:        - Discharge patient to home (with escort).                        - Resume previous diet. Procedure Code(s):     --- Professional ---                        [id number], Colonoscopy, flexible; with removal of                         tumor(s), polyp(s), or other lesion(s) by snare                         technique Diagnosis Code(s):     --- Professional ---                        Z12.11, Encounter for screening for malignant neoplasm                         of colon                        K63.5, Polyp of colon CPT copyright 2019 American Medical Association. All rights reserved. The codes documented in this report are preliminary and upon coder review may  be revised to meet current compliance requirements. Wyline Mood, MD Wyline Mood MD, MD 09/21/2020 8:15:03 AM This report has been signed electronically. Number of Addenda: 0 Note Initiated On: 09/21/2020 7:29 AM  Scope Withdrawal Time: 0 hours 11 minutes 48 seconds  Total Procedure Duration: 0 hours 17 minutes 44 seconds  Estimated Blood Loss:  Estimated blood loss: none.      [facility]

## 2020-09-21 NOTE — Anesthesia Procedure Notes (Signed)
Date/Time: 09/21/2020 7:50 AM Performed by: Ginger Carne, CRNA Pre-anesthesia Checklist: Patient identified, Emergency Drugs available, Suction available, Patient being monitored and Timeout performed Patient Re-evaluated:Patient Re-evaluated prior to induction Oxygen Delivery Method: Nasal cannula Preoxygenation: Pre-oxygenation with 100% oxygen Induction Type: IV induction

## 2020-09-22 ENCOUNTER — Encounter: Payer: Self-pay | Admitting: Gastroenterology

## 2020-09-27 LAB — SURGICAL PATHOLOGY

## 2020-09-28 ENCOUNTER — Encounter: Payer: Self-pay | Admitting: Gastroenterology

## 2020-12-21 ENCOUNTER — Encounter: Payer: Self-pay | Admitting: *Deleted

## 2020-12-21 ENCOUNTER — Ambulatory Visit: Payer: Medicare HMO | Admitting: Gastroenterology

## 2020-12-21 DIAGNOSIS — G919 Hydrocephalus, unspecified: Secondary | ICD-10-CM | POA: Insufficient documentation

## 2020-12-21 DIAGNOSIS — H9191 Unspecified hearing loss, right ear: Secondary | ICD-10-CM | POA: Insufficient documentation

## 2020-12-21 DIAGNOSIS — F32A Depression, unspecified: Secondary | ICD-10-CM | POA: Insufficient documentation

## 2020-12-21 DIAGNOSIS — H9319 Tinnitus, unspecified ear: Secondary | ICD-10-CM | POA: Insufficient documentation

## 2021-03-30 ENCOUNTER — Ambulatory Visit: Payer: Medicare HMO | Admitting: Gastroenterology

## 2021-03-30 ENCOUNTER — Other Ambulatory Visit: Payer: Self-pay

## 2021-03-30 ENCOUNTER — Encounter: Payer: Self-pay | Admitting: Gastroenterology

## 2021-03-30 VITALS — BP 138/91 | HR 92 | Temp 99.0°F | Wt 158.2 lb

## 2021-03-30 DIAGNOSIS — K219 Gastro-esophageal reflux disease without esophagitis: Secondary | ICD-10-CM

## 2021-03-30 NOTE — Patient Instructions (Signed)
Mediterranean Diet A Mediterranean diet refers to food and lifestyle choices that are based on the traditions of countries located on the [place]. This way of eating has been shown to help prevent certain conditions and improve outcomes for people who have chronic diseases, like kidney disease and heart disease. What are tips for following this plan? Lifestyle  Cook and eat meals together with your family, when possible.  Drink enough fluid to keep your urine clear or pale yellow.  Be physically active every day. This includes: ? Aerobic exercise like running or swimming. ? Leisure activities like gardening, walking, or housework.  Get 7-8 hours of sleep each night.  If recommended by your health care provider, drink red wine in moderation. This means 1 glass a day for nonpregnant women and 2 glasses a day for men. A glass of wine equals 5 oz (150 mL). Reading food labels  Check the serving size of packaged foods. For foods such as rice and pasta, the serving size refers to the amount of cooked product, not dry.  Check the total fat in packaged foods. Avoid foods that have saturated fat or trans fats.  Check the ingredients list for added sugars, such as corn syrup.   Shopping  At the grocery store, buy most of your food from the areas near the walls of the store. This includes: ? Fresh fruits and vegetables (produce). ? Grains, beans, nuts, and seeds. Some of these may be available in unpackaged forms or large amounts (in bulk). ? Fresh seafood. ? Poultry and eggs. ? Low-fat dairy products.  Buy whole ingredients instead of prepackaged foods.  Buy fresh fruits and vegetables in-season from local farmers markets.  Buy frozen fruits and vegetables in resealable bags.  If you do not have access to quality fresh seafood, buy precooked frozen shrimp or canned fish, such as tuna, salmon, or sardines.  Buy small amounts of raw or cooked vegetables, salads, or olives from  the deli or salad bar at your store.  Stock your pantry so you always have certain foods on hand, such as olive oil, canned tuna, canned tomatoes, rice, pasta, and beans. Cooking  Cook foods with extra-virgin olive oil instead of using butter or other vegetable oils.  Have meat as a side dish, and have vegetables or grains as your main dish. This means having meat in small portions or adding small amounts of meat to foods like pasta or stew.  Use beans or vegetables instead of meat in common dishes like chili or lasagna.  Experiment with different cooking methods. Try roasting or broiling vegetables instead of steaming or sauteing them.  Add frozen vegetables to soups, stews, pasta, or rice.  Add nuts or seeds for added healthy fat at each meal. You can add these to yogurt, salads, or vegetable dishes.  Marinate fish or vegetables using olive oil, lemon juice, garlic, and fresh herbs. Meal planning  Plan to eat 1 vegetarian meal one day each week. Try to work up to 2 vegetarian meals, if possible.  Eat seafood 2 or more times a week.  Have healthy snacks readily available, such as: ? Vegetable sticks with hummus. ? Greek yogurt. ? Fruit and nut trail mix.  Eat balanced meals throughout the week. This includes: ? Fruit: 2-3 servings a day ? Vegetables: 4-5 servings a day ? Low-fat dairy: 2 servings a day ? Fish, poultry, or lean meat: 1 serving a day ? Beans and legumes: 2 or more servings a week ?   Nuts and seeds: 1-2 servings a day ? Whole grains: 6-8 servings a day ? Extra-virgin olive oil: 3-4 servings a day  Limit red meat and sweets to only a few servings a month   What are my food choices?  Mediterranean diet ? Recommended  Grains: Whole-grain pasta. Brown rice. Bulgar wheat. Polenta. Couscous. Whole-wheat bread. Oatmeal. Quinoa.  Vegetables: Artichokes. Beets. Broccoli. Cabbage. Carrots. Eggplant. Green beans. Chard. Kale. Spinach. Onions. Leeks. Peas. Squash.  Tomatoes. Peppers. Radishes.  Fruits: Apples. Apricots. Avocado. Berries. Bananas. Cherries. Dates. Figs. Grapes. Lemons. Melon. Oranges. Peaches. Plums. Pomegranate.  Meats and other protein foods: Beans. Almonds. Sunflower seeds. Pine nuts. Peanuts. Cod. Salmon. Scallops. Shrimp. Tuna. Tilapia. Clams. Oysters. Eggs.  Dairy: Low-fat milk. Cheese. Greek yogurt.  Beverages: Water. Red wine. Herbal tea.  Fats and oils: Extra virgin olive oil. Avocado oil. Grape seed oil.  Sweets and desserts: Greek yogurt with honey. Baked apples. Poached pears. Trail mix.  Seasoning and other foods: Basil. Cilantro. Coriander. Cumin. Mint. Parsley. Sage. Rosemary. Tarragon. Garlic. Oregano. Thyme. Pepper. Balsalmic vinegar. Tahini. Hummus. Tomato sauce. Olives. Mushrooms. ? Limit these  Grains: Prepackaged pasta or rice dishes. Prepackaged cereal with added sugar.  Vegetables: Deep fried potatoes (french fries).  Fruits: Fruit canned in syrup.  Meats and other protein foods: Beef. Pork. Lamb. Poultry with skin. Hot dogs. Bacon.  Dairy: Ice cream. Sour cream. Whole milk.  Beverages: Juice. Sugar-sweetened soft drinks. Beer. Liquor and spirits.  Fats and oils: Butter. Canola oil. Vegetable oil. Beef fat (tallow). Lard.  Sweets and desserts: Cookies. Cakes. Pies. Candy.  Seasoning and other foods: Mayonnaise. Premade sauces and marinades. The items listed may not be a complete list. Talk with your dietitian about what dietary choices are right for you. Summary  The Mediterranean diet includes both food and lifestyle choices.  Eat a variety of fresh fruits and vegetables, beans, nuts, seeds, and whole grains.  Limit the amount of red meat and sweets that you eat.  Talk with your health care provider about whether it is safe for you to drink red wine in moderation. This means 1 glass a day for nonpregnant women and 2 glasses a day for men. A glass of wine equals 5 oz (150 mL). This information  is not intended to replace advice given to you by your health care provider. Make sure you discuss any questions you have with your health care provider. Document Revised: 02/23/2016 Document Reviewed: 02/16/2016 Elsevier Patient Education  2020 Elsevier Inc.  

## 2021-03-30 NOTE — Progress Notes (Signed)
Wyline Mood MD, MRCP(U.K) [address]  [address]  [place], [place] [address]  Main: [phone]  Fax: [phone]   Primary Care Physician: Preston Fleeting, MD  Primary Gastroenterologist:  Dr. Wyline Mood    C/c : follow up for GERD   HPI: Gregory Rose is a 60 y.o. male  Summary of history :  Initially referred and seen back in 08/2020 for GERD < 10 years , on omeprazole 20 mg BID  Interval history   21/12/2020-03/30/2021  09/21/2020: Colonoscopy : 5 mm polyp resected. Inflammatory polyp.   He is doing very well.  Takes omeprazole unsure of the dose but takes it before breakfast.  Presently has no symptoms of acid reflux.  Current Outpatient Medications  Medication Sig Dispense Refill   atorvastatin (LIPITOR) 40 MG tablet      hydrochlorothiazide (HYDRODIURIL) 25 MG tablet TAKE ONE TABLET BY MOUTH ONCE DAILY     ipratropium (ATROVENT) 0.06 % nasal spray Place 2 sprays into both nostrils 4 (four) times daily as needed for rhinitis. 15 mL 0   lamoTRIgine (LAMICTAL) 100 MG tablet      lisinopril (ZESTRIL) 10 MG tablet Take by mouth.     losartan (COZAAR) 50 MG tablet Take by mouth.     omeprazole (PRILOSEC) 20 MG capsule Take 20 mg by mouth daily.     tamsulosin (FLOMAX) 0.4 MG CAPS capsule Take 1 capsule (0.4 mg total) by mouth daily. 14 capsule 0   benzonatate (TESSALON) 200 MG capsule Take 1 capsule (200 mg total) by mouth 3 (three) times daily as needed for cough. (Patient not taking: Reported on 03/30/2021) 20 capsule 0   famotidine (PEPCID) 20 MG tablet Take 20 mg by mouth 2 (two) times daily. (Patient not taking: Reported on 03/30/2021)     traMADol (ULTRAM) 50 MG tablet Take 1 tablet (50 mg total) by mouth every 8 (eight) hours as needed for moderate pain or severe pain. (Patient not taking: Reported on 03/30/2021) 15 tablet 0   No current facility-administered medications for this visit.    Allergies as of 03/30/2021   (No Known Allergies)     ROS:  General: Negative for anorexia, weight loss, fever, chills, fatigue, weakness. ENT: Negative for hoarseness, difficulty swallowing , nasal congestion. CV: Negative for chest pain, angina, palpitations, dyspnea on exertion, peripheral edema.  Respiratory: Negative for dyspnea at rest, dyspnea on exertion, cough, sputum, wheezing.  GI: See history of present illness. GU:  Negative for dysuria, hematuria, urinary incontinence, urinary frequency, nocturnal urination.  Endo: Negative for unusual weight change.    Physical Examination:   BP (!) 138/91   Pulse 92   Temp 99 F (37.2 C) (Oral)   Wt 158 lb 3.2 oz (71.8 kg)   BMI 30.90 kg/m   General: Well-nourished, well-developed in no acute distress.  Eyes: No icterus. Conjunctivae pink. Neuro: Alert and oriented x 3.  Grossly intact. Skin: Warm and dry, no jaundice.   Psych: Alert and cooperative, normal mood and affect.   Imaging Studies: No results found.  Assessment and Plan:   Gregory Rose is a 60 y.o. y/o male here to follow up for GERD.  Doing very well on PPI.  Advised him to look at the dose of the PPI if taking 40 mg suggest him to decrease it to 20 mg once a day which would be the lowest dose.  Again talked him about lifestyle changes advised him to lose weight, advised him  to have small meals more often, avoid eating for 2 hours before bedtime.  I have discussed with him about Mediterranean diet as part of a healthy lifestyle.  I provided him patient information about the same and picture of a wedge pillow for him to look at and purchased from the store.    Dr Wyline Mood  MD,MRCP [facility]) Follow up in as needed

## 2022-05-01 ENCOUNTER — Ambulatory Visit: Payer: Medicare HMO | Admitting: Urology

## 2022-05-01 ENCOUNTER — Encounter: Payer: Self-pay | Admitting: Urology

## 2022-05-01 VITALS — BP 117/73 | HR 107 | Ht 61.0 in | Wt 149.0 lb

## 2022-05-01 DIAGNOSIS — R972 Elevated prostate specific antigen [PSA]: Secondary | ICD-10-CM

## 2022-05-01 DIAGNOSIS — N529 Male erectile dysfunction, unspecified: Secondary | ICD-10-CM

## 2022-05-01 DIAGNOSIS — N401 Enlarged prostate with lower urinary tract symptoms: Secondary | ICD-10-CM

## 2022-05-01 LAB — BLADDER SCAN AMB NON-IMAGING

## 2022-05-01 MED ORDER — SILDENAFIL CITRATE 100 MG PO TABS: 50.0000 mg | ORAL_TABLET | Freq: Every day | ORAL | 11 refills | Status: AC | PRN

## 2022-05-01 NOTE — Progress Notes (Signed)
   05/01/22 2:49 PM   Latina Craver Peil Mar 20, 1961 [id number]  CC: Elevated PSA, BPH, ED  HPI: I saw Mr. Odeh today for the above issues.  He was recently found to have a slowly rising PSA of 3.8 in November 2021, 4.6 in July 2022, and 6.2 in October 2023.  No prior imaging to review, has never undergone biopsy previously.  He reportedly was previously started on Flomax by urology at [facility] for mild urinary symptoms.  He remains on that medication, but really denies any urinary symptoms today with IPSS score of 0 and normal PVR of 91.  He also has problems with erections, and does not think he has ever tried medications for this previously.  He does not know if he has any family history of prostate cancer.   PMH: Past Medical History:  Diagnosis Date   Hypertension    Reflux gastritis    Seizures ([place])     Surgical History: Past Surgical History:  Procedure Laterality Date   COLONOSCOPY WITH PROPOFOL N/A 09/21/2020   Procedure: COLONOSCOPY WITH PROPOFOL;  Surgeon: Jonathon Bellows, MD;  Location: [facility] ENDOSCOPY;  Service: Gastroenterology;  Laterality: N/A;   SHUNT EXTERNALIZATION       Family History: No family history on file.  Social History:  reports that he has never smoked. He has never been exposed to tobacco smoke. He has never used smokeless tobacco. He reports that he does not drink alcohol and does not use drugs.  Physical Exam: BP 117/73   Pulse (!) 107   Ht 5\' 1"  (1.549 m)   Wt 149 lb (67.6 kg)   BMI 28.15 kg/m    Constitutional:  Alert and oriented, No acute distress. Cardiovascular: No clubbing, cyanosis, or edema. Respiratory: Normal respiratory effort, no increased work of breathing. GI: Abdomen is soft, nontender, nondistended, no abdominal masses DRE: Patient deferred  Assessment & Plan:   61 year old male with slow rise in the PSA, 6.2 this year from 4.6 last year, and ED interested in trial of medications.  We reviewed the implications of an elevated PSA  and the uncertainty surrounding it. In general, a man's PSA increases with age and is produced by both normal and cancerous prostate tissue. The differential diagnosis for elevated PSA includes BPH, prostate cancer, infection, recent intercourse/ejaculation, recent urethroscopic manipulation (foley placement/cystoscopy) or trauma, and prostatitis.   Management of an elevated PSA can include observation or prostate biopsy and we discussed this in detail. Our goal is to detect clinically significant prostate cancers, and manage with either active surveillance, surgery, or radiation for localized disease. Risks of prostate biopsy include bleeding, infection (including life threatening sepsis), pain, and lower urinary symptoms. Hematuria, hematospermia, and blood in the stool are all common after biopsy and can persist up to 4 weeks.   We discussed options for his PSA including repeat PSA with reflex to free, prostate MRI, or prostate biopsy.  Risks and benefits discussed at length.  He remains very averse to biopsy and would like to start with prostate MRI.  We discussed possible need for biopsy pending MRI findings.  I recommended stopping the Flomax as he really does not seem to have any type of urinary symptoms at this time, and he was interested in a trial of sildenafil 50 to 100 mg on demand  RTC to review MRI results in person  Nickolas Madrid, MD 05/01/2022  [place] [address], [place], [place] [address] [phone]

## 2022-05-01 NOTE — Patient Instructions (Signed)

## 2022-05-29 ENCOUNTER — Ambulatory Visit: Payer: Medicare HMO | Admitting: Urology
